# Patient Record
Sex: Male | Born: 1960 | Race: White | Hispanic: No | Marital: Married | Smoking: Former smoker
Health system: Southern US, Community
[De-identification: ages and names within clinical notes are randomized; demographics above are authoritative.]

## PROBLEM LIST (undated history)

## (undated) DIAGNOSIS — G4700 Insomnia, unspecified: Secondary | ICD-10-CM

## (undated) DIAGNOSIS — Z87891 Personal history of nicotine dependence: Secondary | ICD-10-CM

## (undated) DIAGNOSIS — T7840XA Allergy, unspecified, initial encounter: Secondary | ICD-10-CM

## (undated) HISTORY — DX: Allergy, unspecified, initial encounter: T78.40XA

## (undated) HISTORY — PX: MELANOMA EXCISION: SHX5266

## (undated) HISTORY — DX: Insomnia, unspecified: G47.00

## (undated) HISTORY — DX: Personal history of nicotine dependence: Z87.891

---

## 2006-10-26 ENCOUNTER — Ambulatory Visit (HOSPITAL_COMMUNITY): Admission: RE | Admit: 2006-10-26 | Discharge: 2006-10-26 | Payer: Self-pay | Admitting: Family Medicine

## 2006-12-13 ENCOUNTER — Encounter: Admission: RE | Admit: 2006-12-13 | Discharge: 2006-12-13 | Payer: Self-pay | Admitting: Family Medicine

## 2013-05-03 ENCOUNTER — Other Ambulatory Visit: Payer: Self-pay | Admitting: Family Medicine

## 2013-05-03 ENCOUNTER — Ambulatory Visit
Admission: RE | Admit: 2013-05-03 | Discharge: 2013-05-03 | Disposition: A | Discharge status: Home / Self Care | Payer: 59 | Source: Ambulatory Visit | Attending: Family Medicine | Admitting: Family Medicine

## 2013-05-03 DIAGNOSIS — M5430 Sciatica, unspecified side: Secondary | ICD-10-CM

## 2016-03-03 DIAGNOSIS — Z85828 Personal history of other malignant neoplasm of skin: Secondary | ICD-10-CM | POA: Diagnosis not present

## 2016-03-03 DIAGNOSIS — D220 Melanocytic nevi of lip: Secondary | ICD-10-CM | POA: Diagnosis not present

## 2016-03-03 DIAGNOSIS — L738 Other specified follicular disorders: Secondary | ICD-10-CM | POA: Diagnosis not present

## 2016-11-09 DIAGNOSIS — E785 Hyperlipidemia, unspecified: Secondary | ICD-10-CM | POA: Diagnosis not present

## 2016-11-09 DIAGNOSIS — G4700 Insomnia, unspecified: Secondary | ICD-10-CM | POA: Diagnosis not present

## 2016-11-09 DIAGNOSIS — E781 Pure hyperglyceridemia: Secondary | ICD-10-CM | POA: Diagnosis not present

## 2016-11-09 DIAGNOSIS — K219 Gastro-esophageal reflux disease without esophagitis: Secondary | ICD-10-CM | POA: Diagnosis not present

## 2017-04-20 DIAGNOSIS — L300 Nummular dermatitis: Secondary | ICD-10-CM | POA: Diagnosis not present

## 2017-04-20 DIAGNOSIS — Z85828 Personal history of other malignant neoplasm of skin: Secondary | ICD-10-CM | POA: Diagnosis not present

## 2017-04-20 DIAGNOSIS — B078 Other viral warts: Secondary | ICD-10-CM | POA: Diagnosis not present

## 2017-04-20 DIAGNOSIS — D225 Melanocytic nevi of trunk: Secondary | ICD-10-CM | POA: Diagnosis not present

## 2017-05-12 DIAGNOSIS — Z0000 Encounter for general adult medical examination without abnormal findings: Secondary | ICD-10-CM | POA: Diagnosis not present

## 2017-05-12 DIAGNOSIS — E7800 Pure hypercholesterolemia, unspecified: Secondary | ICD-10-CM | POA: Diagnosis not present

## 2017-05-12 DIAGNOSIS — R739 Hyperglycemia, unspecified: Secondary | ICD-10-CM | POA: Diagnosis not present

## 2017-05-12 DIAGNOSIS — G4700 Insomnia, unspecified: Secondary | ICD-10-CM | POA: Diagnosis not present

## 2017-05-12 DIAGNOSIS — K219 Gastro-esophageal reflux disease without esophagitis: Secondary | ICD-10-CM | POA: Diagnosis not present

## 2018-04-01 DIAGNOSIS — M545 Low back pain: Secondary | ICD-10-CM | POA: Diagnosis not present

## 2018-04-13 ENCOUNTER — Other Ambulatory Visit: Payer: Self-pay | Admitting: Family Medicine

## 2018-04-13 DIAGNOSIS — M5431 Sciatica, right side: Secondary | ICD-10-CM

## 2018-04-28 DIAGNOSIS — K219 Gastro-esophageal reflux disease without esophagitis: Secondary | ICD-10-CM | POA: Diagnosis not present

## 2018-04-28 DIAGNOSIS — E782 Mixed hyperlipidemia: Secondary | ICD-10-CM | POA: Diagnosis not present

## 2018-04-28 DIAGNOSIS — G4700 Insomnia, unspecified: Secondary | ICD-10-CM | POA: Diagnosis not present

## 2018-04-28 DIAGNOSIS — R7303 Prediabetes: Secondary | ICD-10-CM | POA: Diagnosis not present

## 2019-01-09 ENCOUNTER — Other Ambulatory Visit: Payer: Self-pay

## 2019-01-09 DIAGNOSIS — Z20822 Contact with and (suspected) exposure to covid-19: Secondary | ICD-10-CM

## 2019-01-10 LAB — NOVEL CORONAVIRUS, NAA: SARS-CoV-2, NAA: NOT DETECTED

## 2021-03-11 ENCOUNTER — Other Ambulatory Visit: Payer: Self-pay | Admitting: Family Medicine

## 2021-03-11 ENCOUNTER — Ambulatory Visit
Admission: RE | Admit: 2021-03-11 | Discharge: 2021-03-11 | Disposition: A | Discharge status: Home / Self Care | Payer: 59 | Source: Ambulatory Visit | Attending: Family Medicine | Admitting: Family Medicine

## 2021-03-11 DIAGNOSIS — R059 Cough, unspecified: Secondary | ICD-10-CM

## 2021-03-27 ENCOUNTER — Institutional Professional Consult (permissible substitution): Payer: 59 | Admitting: Pulmonary Disease

## 2021-03-31 ENCOUNTER — Other Ambulatory Visit: Payer: Self-pay

## 2021-03-31 ENCOUNTER — Ambulatory Visit: Payer: 59 | Admitting: Pulmonary Disease

## 2021-03-31 ENCOUNTER — Encounter: Payer: Self-pay | Admitting: Pulmonary Disease

## 2021-03-31 VITALS — BP 130/76 | HR 75 | Temp 98.6°F | Ht 69.0 in | Wt 210.6 lb

## 2021-03-31 DIAGNOSIS — Z720 Tobacco use: Secondary | ICD-10-CM

## 2021-03-31 DIAGNOSIS — R0609 Other forms of dyspnea: Secondary | ICD-10-CM | POA: Diagnosis not present

## 2021-03-31 DIAGNOSIS — R062 Wheezing: Secondary | ICD-10-CM | POA: Diagnosis not present

## 2021-03-31 NOTE — Patient Instructions (Signed)
Thank you for visiting Dr. Valeta Harms at [facility]. Today we recommend the following:  Orders Placed This Encounter  Procedures   Ambulatory Referral for Lung Cancer Scre   Pulmonary Function Test   Return in about 6 weeks (around 05/12/2021) for with APP or Dr. Valeta Harms.    Please do your part to reduce the spread of COVID-19.

## 2021-03-31 NOTE — Progress Notes (Signed)
Synopsis: Referred in February 2023 for COPD by Antony Contras, MD  Subjective:   PATIENT ID: Joseph Vance GENDER: male DOB: [birth year], MRN: [id number]  Chief Complaint  Patient presents with   Consult    Consult. Patient says he has been having trouble breathing.     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  He has smoked for 30+ years.  He quit back in December.  He had what sounds to be a mild COPD exacerbation.  He was treated with primary care started on albuterol and Trelegy sample.  He did well with this it broke his wheezing and chest tightness.  He now feels much better.  He is also starting to breathe better since he quit smoking.  He is able to complete all of his activities of daily living.  He is not limited with any significant exertional issues.   Past Medical History:  Diagnosis Date   Allergies    Former smoker    Insomnia      Family History  Problem Relation Age of Onset   Heart disease Father      Past Surgical History:  Procedure Laterality Date   MELANOMA EXCISION      Social History   Socioeconomic History   Marital status: Married    Spouse name: Not on file   Number of children: Not on file   Years of education: Not on file   Highest education level: Not on file  Occupational History   Not on file  Tobacco Use   Smoking status: Former    Types: Cigarettes   Smokeless tobacco: Never  Substance and Sexual Activity   Alcohol use: Never   Drug use: Never   Sexual activity: Never  Other Topics Concern   Not on file  Social History Narrative   Not on file   Social Determinants of Health   Financial Resource Strain: Not on file  Food Insecurity: Not on file  Transportation Needs: Not on file  Physical Activity: Not on file  Stress: Not on file  Social Connections: Not on file  Intimate Partner Violence: Not on file     Allergies  Allergen  Reactions   Codeine Rash   Sulfa Antibiotics Rash     Outpatient Medications Prior to Visit  Medication Sig Dispense Refill   albuterol (VENTOLIN HFA) 108 (90 Base) MCG/ACT inhaler      atorvastatin (LIPITOR) 20 MG tablet Take 1 tablet by mouth at bedtime.     Azelastine HCl 0.15 % SOLN SMARTSIG:2 Spray(s) Both Nares Twice Daily PRN     azithromycin (ZITHROMAX) 250 MG tablet Take 250 mg by mouth as directed.     cetirizine (ZYRTEC) 10 MG tablet Take 10 mg by mouth daily.     esomeprazole (NEXIUM) 40 MG capsule TAKE 1 CAPSULE BY MOUTH EVERY DAY IN THE MORNING     fluticasone (FLONASE) 50 MCG/ACT nasal spray instill 2 sprays into each nostril daily     fluticasone (FLOVENT HFA) 110 MCG/ACT inhaler TAKE 1 PUFF BY MOUTH TWICE A DAY     meloxicam (MOBIC) 15 MG tablet Take 1 tablet by mouth daily.     zolpidem (AMBIEN) 10 MG tablet 0.5 - 1 tablet     No facility-administered medications prior to visit.    Review of Systems  Constitutional:  Negative for chills, fever, malaise/fatigue and weight loss.  HENT:  Negative  for hearing loss, sore throat and tinnitus.   Eyes:  Negative for blurred vision and double vision.  Respiratory:  Positive for cough and wheezing. Negative for hemoptysis, sputum production, shortness of breath and stridor.   Cardiovascular:  Negative for chest pain, palpitations, orthopnea, leg swelling and PND.  Gastrointestinal:  Negative for abdominal pain, constipation, diarrhea, heartburn, nausea and vomiting.  Genitourinary:  Negative for dysuria, hematuria and urgency.  Musculoskeletal:  Negative for joint pain and myalgias.  Skin:  Negative for itching and rash.  Neurological:  Negative for dizziness, tingling, weakness and headaches.  Endo/Heme/Allergies:  Negative for environmental allergies. Does not bruise/bleed easily.  Psychiatric/Behavioral:  Negative for depression. The patient is not nervous/anxious and does not have insomnia.   All other systems reviewed  and are negative.   Objective:  Physical Exam Vitals reviewed.  Constitutional:      General: He is not in acute distress.    Appearance: He is well-developed. He is obese.  HENT:     Head: Normocephalic and atraumatic.  Eyes:     General: No scleral icterus.    Conjunctiva/sclera: Conjunctivae normal.     Pupils: Pupils are equal, round, and reactive to light.  Neck:     Vascular: No JVD.     Trachea: No tracheal deviation.  Cardiovascular:     Rate and Rhythm: Normal rate and regular rhythm.     Heart sounds: Normal heart sounds. No murmur heard. Pulmonary:     Effort: Pulmonary effort is normal. No tachypnea, accessory muscle usage or respiratory distress.     Breath sounds: No stridor. No wheezing, rhonchi or rales.  Abdominal:     General: There is no distension.     Palpations: Abdomen is soft.     Tenderness: There is no abdominal tenderness.  Musculoskeletal:        General: No tenderness.     Cervical back: Neck supple.  Lymphadenopathy:     Cervical: No cervical adenopathy.  Skin:    General: Skin is warm and dry.     Capillary Refill: Capillary refill takes less than 2 seconds.     Findings: No rash.  Neurological:     Mental Status: He is alert and oriented to person, place, and time.  Psychiatric:        Behavior: Behavior normal.     Vitals:   03/31/21 1534  BP: 130/76  Pulse: 75  Temp: 98.6 F (37 C)  TempSrc: Oral  SpO2: 98%  Weight: 210 lb 9.6 oz (95.5 kg)  Height: 5\' 9"  (1.753 m)   98% on RA BMI Readings from Last 3 Encounters:  03/31/21 31.10 kg/m   Wt Readings from Last 3 Encounters:  03/31/21 210 lb 9.6 oz (95.5 kg)     CBC No results found for: WBC, RBC, HGB, HCT, PLT, MCV, MCH, MCHC, RDW, LYMPHSABS, MONOABS, EOSABS, BASOSABS    Chest Imaging:   Pulmonary Functions Testing Results: No flowsheet data found.  FeNO:   Pathology:   Echocardiogram:   Heart Catheterization:     Assessment & Plan:     ICD-10-CM   1.  Tobacco use  Z72.0 Ambulatory Referral for Lung Cancer Scre    Pulmonary Function Test    2. DOE (dyspnea on exertion)  R06.09     3. Wheezing  R06.2       Discussion:  This is a 61 year old gentleman sounds like he had a recent COPD exacerbation, responded to Trelegy treatment with as needed  albuterol.  He is also quit smoking since this past December and quit any alcohol use even though he occasionally used alcohol.  From a respiratory standpoint he is improved.  Plan: Would recommend him to be enrolled in lung cancer screening program as he has smoked for several years quit just this past December. As for COPD he has not had any pulmonary function tests. Not interested in starting inhaler for daily regimen as he absolutely needed it. We will start with getting full PFTs and I explained that if he does have mild disease he may benefit from a inhaler to help prevent exacerbation.  He is okay with this.  Follow-up with [place] after his pulmonary function tests are complete to determine next best steps   Current Outpatient Medications:    albuterol (VENTOLIN HFA) 108 (90 Base) MCG/ACT inhaler, , Disp: , Rfl:    atorvastatin (LIPITOR) 20 MG tablet, Take 1 tablet by mouth at bedtime., Disp: , Rfl:    Azelastine HCl 0.15 % SOLN, SMARTSIG:2 Spray(s) Both Nares Twice Daily PRN, Disp: , Rfl:    azithromycin (ZITHROMAX) 250 MG tablet, Take 250 mg by mouth as directed., Disp: , Rfl:    cetirizine (ZYRTEC) 10 MG tablet, Take 10 mg by mouth daily., Disp: , Rfl:    esomeprazole (NEXIUM) 40 MG capsule, TAKE 1 CAPSULE BY MOUTH EVERY DAY IN THE MORNING, Disp: , Rfl:    fluticasone (FLONASE) 50 MCG/ACT nasal spray, instill 2 sprays into each nostril daily, Disp: , Rfl:    fluticasone (FLOVENT HFA) 110 MCG/ACT inhaler, TAKE 1 PUFF BY MOUTH TWICE A DAY, Disp: , Rfl:    meloxicam (MOBIC) 15 MG tablet, Take 1 tablet by mouth daily., Disp: , Rfl:    zolpidem (AMBIEN) 10 MG tablet, 0.5 - 1 tablet, Disp: , Rfl:     Garner Nash, DO [facility] 03/31/2021 3:48 PM

## 2021-05-01 ENCOUNTER — Other Ambulatory Visit: Payer: Self-pay

## 2021-05-01 ENCOUNTER — Ambulatory Visit: Payer: 59 | Admitting: Pulmonary Disease

## 2021-05-01 ENCOUNTER — Ambulatory Visit (INDEPENDENT_AMBULATORY_CARE_PROVIDER_SITE_OTHER): Payer: 59 | Admitting: Pulmonary Disease

## 2021-05-01 ENCOUNTER — Encounter: Payer: Self-pay | Admitting: Pulmonary Disease

## 2021-05-01 VITALS — BP 118/72 | HR 78 | Temp 97.8°F | Ht 69.0 in | Wt 209.0 lb

## 2021-05-01 DIAGNOSIS — R062 Wheezing: Secondary | ICD-10-CM

## 2021-05-01 DIAGNOSIS — Z720 Tobacco use: Secondary | ICD-10-CM | POA: Diagnosis not present

## 2021-05-01 LAB — PULMONARY FUNCTION TEST
DL/VA % pred: 129 %
DL/VA: 5.47 ml/min/mmHg/L
DLCO cor % pred: 109 %
DLCO cor: 29.04 ml/min/mmHg
DLCO unc % pred: 109 %
DLCO unc: 29.04 ml/min/mmHg
FEF 25-75 Post: 4.59 L/sec
FEF 25-75 Pre: 4.25 L/sec
FEF2575-%Change-Post: 8 %
FEF2575-%Pred-Post: 164 %
FEF2575-%Pred-Pre: 152 %
FEV1-%Change-Post: 4 %
FEV1-%Pred-Post: 102 %
FEV1-%Pred-Pre: 98 %
FEV1-Post: 3.51 L
FEV1-Pre: 3.37 L
FEV1FVC-%Change-Post: 1 %
FEV1FVC-%Pred-Pre: 112 %
FEV6-%Change-Post: 1 %
FEV6-%Pred-Post: 93 %
FEV6-%Pred-Pre: 91 %
FEV6-Post: 4.02 L
FEV6-Pre: 3.96 L
FEV6FVC-%Pred-Post: 105 %
FEV6FVC-%Pred-Pre: 105 %
FVC-%Change-Post: 2 %
FVC-%Pred-Post: 89 %
FVC-%Pred-Pre: 87 %
FVC-Post: 4.05 L
FVC-Pre: 3.96 L
Post FEV1/FVC ratio: 87 %
Post FEV6/FVC ratio: 100 %
Pre FEV1/FVC ratio: 85 %
Pre FEV6/FVC Ratio: 100 %
RV % pred: 83 %
RV: 1.86 L
TLC % pred: 87 %
TLC: 5.98 L

## 2021-05-01 NOTE — Patient Instructions (Signed)
Thank you for visiting Dr. Valeta Harms at [facility]. ?Today we recommend the following: ? ?Orders Placed This Encounter  ?Procedures  ? Ambulatory Referral for Lung Cancer Scre  ? ?Return if symptoms worsen or fail to improve. ? ? ? ?Please do your part to reduce the spread of COVID-19.  ? ?

## 2021-05-01 NOTE — Progress Notes (Signed)
Full PFT performed today. °

## 2021-05-01 NOTE — Progress Notes (Signed)
? ?Synopsis: Referred in February 2023 for COPD by Antony Contras, MD ? ?Subjective:  ? ?PATIENT ID: Joseph Vance GENDER: male DOB: Mar 14, 1960, MRN: [id number] ? ?Chief Complaint  ?Patient presents with  ? Follow-up  ?  Cough   ? ? 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  He quit back in December.  He had what sounds to be a mild COPD exacerbation.  He was treated with primary care started on albuterol and Trelegy sample.  He did well with this it broke his wheezing and chest tightness.  He now feels much better.  He is also starting to breathe better since he quit smoking.  He is able to complete all of his activities of daily living.  He is not limited with any significant exertional issues. ? ?OV 05/01/2021: when he took trelegy last his coughing and wheezing has stopped. He had pfts today that are normal. PFTs are normal and he has no evidence of COPD.  He thankfully has quit smoking.  He is proud of this.  He also has quit alcohol use.  After he quit smoking his episodes of wheezing and coughing has also improved. ? ? ?Past Medical History:  ?Diagnosis Date  ? Allergies   ? Former smoker   ? Insomnia   ?  ? ?Family History  ?Problem Relation Age of Onset  ? Heart disease Father   ?  ? ?Past Surgical History:  ?Procedure Laterality Date  ? MELANOMA EXCISION    ? ? ?Social History  ? ?Socioeconomic History  ? Marital status: Married  ?  Spouse name: Not on file  ? Number of children: Not on file  ? Years of education: Not on file  ? Highest education level: Not on file  ?Occupational History  ? Not on file  ?Tobacco Use  ? Smoking status: Former  ?  Types: Cigarettes  ? Smokeless tobacco: Never  ? Tobacco comments:  ?  Quit smoking before Christmas 2022  ?Substance and Sexual Activity  ? Alcohol use: Never  ? Drug use: Never  ? Sexual activity: Never  ?Other Topics Concern  ? Not on file  ?Social  History Narrative  ? Not on file  ? ?Social Determinants of Health  ? ?Financial Resource Strain: Not on file  ?Food Insecurity: Not on file  ?Transportation Needs: Not on file  ?Physical Activity: Not on file  ?Stress: Not on file  ?Social Connections: Not on file  ?Intimate Partner Violence: Not on file  ?  ? ?Allergies  ?Allergen Reactions  ? Codeine Rash  ? Sulfa Antibiotics Rash  ?  ? ?Outpatient Medications Prior to Visit  ?Medication Sig Dispense Refill  ? albuterol (VENTOLIN HFA) 108 (90 Base) MCG/ACT inhaler     ? atorvastatin (LIPITOR) 20 MG tablet Take 1 tablet by mouth at bedtime.    ? Azelastine HCl 0.15 % SOLN SMARTSIG:2 Spray(s) Both Nares Twice Daily PRN    ? azithromycin (ZITHROMAX) 250 MG tablet Take 250 mg by mouth as directed.    ? cetirizine (ZYRTEC) 10 MG tablet Take 10 mg by mouth daily.    ? esomeprazole (NEXIUM) 40 MG capsule TAKE 1 CAPSULE BY MOUTH EVERY DAY IN THE MORNING    ? fluticasone (FLONASE) 50 MCG/ACT nasal spray instill 2 sprays into each nostril daily    ? fluticasone (FLOVENT HFA) 110 MCG/ACT  inhaler TAKE 1 PUFF BY MOUTH TWICE A DAY    ? meloxicam (MOBIC) 15 MG tablet Take 1 tablet by mouth daily.    ? zolpidem (AMBIEN) 10 MG tablet 0.5 - 1 tablet    ? ?No facility-administered medications prior to visit.  ? ? ?Review of Systems  ?Constitutional:  Negative for chills, fever, malaise/fatigue and weight loss.  ?HENT:  Negative for hearing loss, sore throat and tinnitus.   ?Eyes:  Negative for blurred vision and double vision.  ?Respiratory:  Negative for cough, hemoptysis, sputum production, shortness of breath, wheezing and stridor.   ?Cardiovascular:  Negative for chest pain, palpitations, orthopnea, leg swelling and PND.  ?Gastrointestinal:  Negative for abdominal pain, constipation, diarrhea, heartburn, nausea and vomiting.  ?Genitourinary:  Negative for dysuria, hematuria and urgency.  ?Musculoskeletal:  Negative for joint pain and myalgias.  ?Skin:  Negative for itching and  rash.  ?Neurological:  Negative for dizziness, tingling, weakness and headaches.  ?Endo/Heme/Allergies:  Negative for environmental allergies. Does not bruise/bleed easily.  ?Psychiatric/Behavioral:  Negative for depression. The patient is not nervous/anxious and does not have insomnia.   ?All other systems reviewed and are negative. ? ? ?Objective:  ?Physical Exam ?Vitals reviewed.  ?Constitutional:   ?   General: He is not in acute distress. ?   Appearance: He is well-developed. He is obese.  ?HENT:  ?   Head: Normocephalic and atraumatic.  ?Eyes:  ?   General: No scleral icterus. ?   Conjunctiva/sclera: Conjunctivae normal.  ?   Pupils: Pupils are equal, round, and reactive to light.  ?Neck:  ?   Vascular: No JVD.  ?   Trachea: No tracheal deviation.  ?Cardiovascular:  ?   Rate and Rhythm: Normal rate and regular rhythm.  ?   Heart sounds: Normal heart sounds. No murmur heard. ?Pulmonary:  ?   Effort: Pulmonary effort is normal. No tachypnea, accessory muscle usage or respiratory distress.  ?   Breath sounds: No stridor. No wheezing, rhonchi or rales.  ?Abdominal:  ?   General: There is no distension.  ?   Palpations: Abdomen is soft.  ?   Tenderness: There is no abdominal tenderness.  ?Musculoskeletal:     ?   General: No tenderness.  ?   Cervical back: Neck supple.  ?Lymphadenopathy:  ?   Cervical: No cervical adenopathy.  ?Skin: ?   General: Skin is warm and dry.  ?   Capillary Refill: Capillary refill takes less than 2 seconds.  ?   Findings: No rash.  ?Neurological:  ?   Mental Status: He is alert and oriented to person, place, and time.  ?Psychiatric:     ?   Behavior: Behavior normal.  ? ? ? ?Vitals:  ? 05/01/21 1623  ?BP: 118/72  ?Pulse: 78  ?Temp: 97.8 ?F (36.6 ?C)  ?TempSrc: Oral  ?SpO2: 98%  ?Weight: 209 lb (94.8 kg)  ?Height: '5\' 9"'$  (1.753 m)  ? ?98% on RA ?BMI Readings from Last 3 Encounters:  ?05/01/21 30.86 kg/m?  ?03/31/21 31.10 kg/m?  ? ?Wt Readings from Last 3 Encounters:  ?05/01/21 209 lb (94.8  kg)  ?03/31/21 210 lb 9.6 oz (95.5 kg)  ? ? ? ?CBC ?No results found for: WBC, RBC, HGB, HCT, PLT, MCV, MCH, MCHC, RDW, LYMPHSABS, MONOABS, EOSABS, BASOSABS ? ? ? ?Chest Imaging: ? ? ?Pulmonary Functions Testing Results: ?PFT Results Latest Ref Rng & Units 05/01/2021  ?FVC-Pre L 3.96  ?FVC-Predicted Pre % 87  ?FVC-Post L  4.05  ?FVC-Predicted Post % 89  ?Pre FEV1/FVC % % 85  ?Post FEV1/FCV % % 87  ?FEV1-Pre L 3.37  ?FEV1-Predicted Pre % 98  ?FEV1-Post L 3.51  ?DLCO uncorrected ml/min/mmHg 29.04  ?DLCO UNC% % 109  ?DLCO corrected ml/min/mmHg 29.04  ?DLCO COR %Predicted % 109  ?DLVA Predicted % 129  ?TLC L 5.98  ?TLC % Predicted % 87  ?RV % Predicted % 83  ? ? ?FeNO:  ? ?Pathology:  ? ?Echocardiogram:  ? ?Heart Catheterization:  ?   ?Assessment & Plan:  ? ?  ICD-10-CM   ?1. Tobacco use  Z72.0 Ambulatory Referral for Lung Cancer Scre  ?  ?2. Wheezing  R06.2   ?  ? ? ?Discussion: ? ?This is a 61 year old gentleman which sound like he had a upper respiratory tract infection/COPD exacerbation that improved after steroids.  He also started Trelegy for short period he stopped using it.  He had PFTs completed today's office visit which revealed no evidence of obstruction. ? ?Plan: ?Enrollment in lung cancer screening program ?No need for inhalers at this time. ?He is can let us know if his symptoms return or change. ?He may benefit from having albuterol as needed. ?Return to clinic to see me as needed. ?Follow annually in LDCT screening program. ? ? ?Current Outpatient Medications:  ?  albuterol (VENTOLIN HFA) 108 (90 Base) MCG/ACT inhaler, , Disp: , Rfl:  ?  atorvastatin (LIPITOR) 20 MG tablet, Take 1 tablet by mouth at bedtime., Disp: , Rfl:  ?  Azelastine HCl 0.15 % SOLN, SMARTSIG:2 Spray(s) Both Nares Twice Daily PRN, Disp: , Rfl:  ?  azithromycin (ZITHROMAX) 250 MG tablet, Take 250 mg by mouth as directed., Disp: , Rfl:  ?  cetirizine (ZYRTEC) 10 MG tablet, Take 10 mg by mouth daily., Disp: , Rfl:  ?  esomeprazole  (NEXIUM) 40 MG capsule, TAKE 1 CAPSULE BY MOUTH EVERY DAY IN THE MORNING, Disp: , Rfl:  ?  fluticasone (FLONASE) 50 MCG/ACT nasal spray, instill 2 sprays into each nostril daily, Disp: , Rfl:  ?  fluticason

## 2021-05-01 NOTE — Patient Instructions (Signed)
Full PFT performed today. °

## 2021-05-21 ENCOUNTER — Other Ambulatory Visit: Payer: Self-pay | Admitting: *Deleted

## 2021-05-21 DIAGNOSIS — Z122 Encounter for screening for malignant neoplasm of respiratory organs: Secondary | ICD-10-CM

## 2021-05-21 DIAGNOSIS — Z87891 Personal history of nicotine dependence: Secondary | ICD-10-CM

## 2021-06-04 ENCOUNTER — Encounter: Payer: Self-pay | Admitting: Acute Care

## 2021-06-04 ENCOUNTER — Ambulatory Visit (INDEPENDENT_AMBULATORY_CARE_PROVIDER_SITE_OTHER): Payer: 59 | Admitting: Acute Care

## 2021-06-04 DIAGNOSIS — Z87891 Personal history of nicotine dependence: Secondary | ICD-10-CM

## 2021-06-04 NOTE — Progress Notes (Signed)
Virtual Visit via Telephone Note ? ?I connected with Olive L Alcaide on 06/04/21 at 12:00 PM EDT by telephone and verified that I am speaking with the correct person using two identifiers. ? ?Location: ?Patient:  At his office ?Provider:  [facility], [place], [place], [address] ? ?  ?I discussed the limitations, risks, security and privacy concerns of performing an evaluation and management service by telephone and the availability of in person appointments. I also discussed with the patient that there may be a patient responsible charge related to this service. The patient expressed understanding and agreed to proceed. ? ? ? ?Shared Decision Making Visit Lung Cancer Screening Program ?[phone]) ? ? ?Eligibility: ?Age 61 y.o. ?Pack Years Smoking History Calculation 35 pack year smoking history ?(# packs/per year x # years smoked) ?Recent History of coughing up blood  no ?Unexplained weight loss? no ?( >Than 15 pounds within the last 6 months ) ?Prior History Lung / other cancer no ?(Diagnosis within the last 5 years already requiring surveillance chest CT Scans). ?Smoking Status Former ?Former Smokers: Years since quit:  < 1 year ? Quit Date:  12/2020 ? ?Visit Components: ?Discussion included one or more decision making aids. yes ?Discussion included risk/benefits of screening. yes ?Discussion included potential follow up diagnostic testing for abnormal scans. yes ?Discussion included meaning and risk of over diagnosis. yes ?Discussion included meaning and risk of False Positives. yes ?Discussion included meaning of total radiation exposure. yes ? ?Counseling Included: ?Importance of adherence to annual lung cancer LDCT screening. yes ?Impact of comorbidities on ability to participate in the program. yes ?Ability and willingness to under diagnostic treatment. yes ? ?Smoking Cessation Counseling: ?Current Smokers:  ?Discussed importance of smoking cessation. yes ?Information about tobacco cessation classes and  interventions provided to patient. yes ?Patient provided with "ticket" for LDCT Scan. yes ?Symptomatic Patient. no ? Counseling NA ?Diagnosis Code: Tobacco Use Z72.0 ?Asymptomatic Patient yes ? Counseling (Intermediate counseling: > three minutes counseling) [id number] ?Former Smokers:  ?Discussed the importance of maintaining cigarette abstinence. yes ?Diagnosis Code: Personal History of Nicotine Dependence. J50.093 ?Information about tobacco cessation classes and interventions provided to patient. Yes ?Patient provided with "ticket" for LDCT Scan. yes ?Written Order for Lung Cancer Screening with LDCT placed in Epic. Yes ?(CT Chest Lung Cancer Screening Low Dose W/O CM) [id number] ?Z12.2-Screening of respiratory organs ?Z87.891-Personal history of nicotine dependence ? ?I spent 25 minutes of face to face time/virtual visit time  with Mr. Carrizales discussing the risks and benefits of lung cancer screening. We took the time to pause the power point at intervals to allow for questions to be asked and answered to ensure understanding. We discussed that he had taken the single most powerful action possible to decrease his risk of developing lung cancer when he quit smoking. I counseled him to remain smoke free, and to contact me if he ever had the desire to smoke again so that I can provide resources and tools to help support the effort to remain smoke free. We discussed the time and location of the scan, and that either  Doroteo Glassman RN, Joella Prince, RN or I  or I will call / send a letter with the results within  24-72 hours of receiving them. He has the office contact information in the event he needs to speak with me,  he verbalized understanding of all of the above and had no further questions upon leaving the office.  ? ? ? ?I explained to the patient  that there has been a high incidence of coronary artery disease noted on these exams. I explained that this is a non-gated exam therefore degree or severity cannot be  determined. This patient is on statin therapy. I have asked the patient to follow-up with their PCP regarding any incidental finding of coronary artery disease and management with diet or medication as they feel is clinically indicated. The patient verbalized understanding of the above and had no further questions. ?  ? ? ?Magdalen Spatz, NP ?06/04/2021 ? ? ? ? ? ? ?

## 2021-06-04 NOTE — Patient Instructions (Signed)
Thank you for participating in the [facility]. It was our pleasure to meet you today. We will call you with the results of your scan within the next few days. Your scan will be assigned a Lung RADS category score by the physicians reading the scans.  This Lung RADS score determines follow up scanning.  See below for description of categories, and follow up screening recommendations. We will be in touch to schedule your follow up screening annually or based on recommendations of our providers. We will fax a copy of your scan results to your Primary Care Physician, or the physician who referred you to the program, to ensure they have the results. Please call the office if you have any questions or concerns regarding your scanning experience or results.  Our office number is [phone]. Please speak with Denise Phelps, RN. , or  Denise Buckner RN, They are  our Lung Cancer Screening RN.'s If They are unavailable when you call, Please leave a message on the voice mail. We will return your call at our earliest convenience.This voice mail is monitored several times a day.  Remember, if your scan is normal, we will scan you annually as long as you continue to meet the criteria for the program. (Age 55-77, Current smoker or smoker who has quit within the last 15 years). If you are a smoker, remember, quitting is the single most powerful action that you can take to decrease your risk of lung cancer and other pulmonary, breathing related problems. We know quitting is hard, and we are here to help.  Please let us know if there is anything we can do to help you meet your goal of quitting. If you are a former smoker, congratulations. We are proud of you! Remain smoke free! Remember you can refer friends or family members through the number above.  We will screen them to make sure they meet criteria for the program. Thank you for helping us take better care of you by  participating in Lung Screening.  You can receive free nicotine replacement therapy ( patches, gum or mints) by calling 1-800-QUIT NOW. Please call so we can get you on the path to becoming  a non-smoker. I know it is hard, but you can do this!  Lung RADS Categories:  Lung RADS 1: no nodules or definitely non-concerning nodules.  Recommendation is for a repeat annual scan in 12 months.  Lung RADS 2:  nodules that are non-concerning in appearance and behavior with a very low likelihood of becoming an active cancer. Recommendation is for a repeat annual scan in 12 months.  Lung RADS 3: nodules that are probably non-concerning , includes nodules with a low likelihood of becoming an active cancer.  Recommendation is for a 6-month repeat screening scan. Often noted after an upper respiratory illness. We will be in touch to make sure you have no questions, and to schedule your 6-month scan.  Lung RADS 4 A: nodules with concerning findings, recommendation is most often for a follow up scan in 3 months or additional testing based on our provider's assessment of the scan. We will be in touch to make sure you have no questions and to schedule the recommended 3 month follow up scan.  Lung RADS 4 B:  indicates findings that are concerning. We will be in touch with you to schedule additional diagnostic testing based on our provider's  assessment of the scan.  Other options for assistance in smoking cessation (   As covered by your insurance benefits)  Hypnosis for smoking cessation  [employer] [phone]  Acupuncture for smoking cessation  [facility] [phone]   

## 2021-06-05 ENCOUNTER — Ambulatory Visit (INDEPENDENT_AMBULATORY_CARE_PROVIDER_SITE_OTHER)
Admission: RE | Admit: 2021-06-05 | Discharge: 2021-06-05 | Disposition: A | Discharge status: Home / Self Care | Payer: 59 | Source: Ambulatory Visit | Attending: Acute Care | Admitting: Acute Care

## 2021-06-05 DIAGNOSIS — Z87891 Personal history of nicotine dependence: Secondary | ICD-10-CM

## 2021-06-05 DIAGNOSIS — Z122 Encounter for screening for malignant neoplasm of respiratory organs: Secondary | ICD-10-CM

## 2021-06-09 ENCOUNTER — Other Ambulatory Visit: Payer: Self-pay

## 2021-06-09 DIAGNOSIS — Z87891 Personal history of nicotine dependence: Secondary | ICD-10-CM

## 2021-06-09 DIAGNOSIS — Z122 Encounter for screening for malignant neoplasm of respiratory organs: Secondary | ICD-10-CM

## 2021-12-18 ENCOUNTER — Ambulatory Visit: Payer: 59 | Admitting: Cardiology

## 2021-12-22 ENCOUNTER — Ambulatory Visit: Payer: 59 | Attending: Cardiology | Admitting: Internal Medicine

## 2021-12-22 ENCOUNTER — Encounter: Payer: Self-pay | Admitting: Internal Medicine

## 2021-12-22 VITALS — BP 136/78 | HR 89 | Ht 68.0 in | Wt 222.0 lb

## 2021-12-22 DIAGNOSIS — R072 Precordial pain: Secondary | ICD-10-CM

## 2021-12-22 DIAGNOSIS — R079 Chest pain, unspecified: Secondary | ICD-10-CM

## 2021-12-22 MED ORDER — ASPIRIN 81 MG PO TBEC: 81.0000 mg | DELAYED_RELEASE_TABLET | Freq: Every day | ORAL | 3 refills | Status: AC

## 2021-12-22 MED ORDER — METOPROLOL TARTRATE 50 MG PO TABS: 50.0000 mg | ORAL_TABLET | Freq: Once | ORAL | 0 refills | Status: DC

## 2021-12-22 NOTE — Patient Instructions (Signed)
Medication Instructions:  START: ASPIRIN '81mg'$  ONCE DAILY  **PLEASE TAKE METOPROLOL TARTRATE '50mg'$  TWO HOURS PRIOR TO CCTA SCAN**  *If you need a refill on your cardiac medications before your next appointment, please call your pharmacy*  Lab Work: BMET BLOOD WORK TODAY  If you have labs (blood work) drawn today and your tests are completely normal, you will receive your results only by: MyChart Message (if you have MyChart) OR A paper copy in the mail If you have any lab test that is abnormal or we need to change your treatment, we will call you to review the results.  Testing/Procedures: Your physician has requested that you have an echocardiogram. Echocardiography is a painless test that uses sound waves to create images of your heart. It provides your doctor with information about the size and shape of your heart and how well your heart's chambers and valves are working. You may receive an ultrasound enhancing agent through an IV if needed to better visualize your heart during the echo.This procedure takes approximately one hour. There are no restrictions for this procedure. This will take place at the [address].   Your physician has requested that you have cardiac CT. Cardiac computed tomography (CT) is a painless test that uses an x-ray machine to take clear, detailed pictures of your heart. For further information please visit [email or web address]. Please follow instruction sheet as given.  Follow-Up: At [facility], you and your health needs are our priority.  As part of our continuing mission to provide you with exceptional heart care, we have created designated Provider Care Teams.  These Care Teams include your primary Cardiologist (physician) and Advanced Practice Providers (APPs -  Physician Assistants and Nurse Practitioners) who all work together to provide you with the care you need, when you need it.  Your next appointment:   3 month(s)  The format  for your next appointment:   In Person  Provider:   Janina Mayo, MD     Other Instructions   Your cardiac CT will be scheduled at one of the below locations:   [facility] [address] [place], [place] [address] [phone]  If scheduled at [facility], please arrive at the [facility] and Children's Entrance (Entrance C2) of [facility] 30 minutes prior to test start time. You can use the FREE valet parking offered at entrance C (encouraged to control the heart rate for the test)  Proceed to the [facility] Radiology Department (first floor) to check-in and test prep.  All radiology patients and guests should use entrance C2 at [facility], accessed from [employer], even though the hospital's physical address listed is [address]     Please follow these instructions carefully (unless otherwise directed):  Hold all erectile dysfunction medications at least 3 days (72 hrs) prior to test. (Ie viagra, cialis, sildenafil, tadalafil, etc) We will administer nitroglycerin during this exam.   On the Night Before the Test: Be sure to Drink plenty of water. Do not consume any caffeinated/decaffeinated beverages or chocolate 12 hours prior to your test. Do not take any antihistamines 12 hours prior to your test.  On the Day of the Test: Drink plenty of water until 1 hour prior to the test. Do not eat any food 1 hour prior to test. You may take your regular medications prior to the test.  Take metoprolol (Lopressor) two hours prior to test. HOLD Furosemide/Hydrochlorothiazide morning  of the test  After the Test: Drink plenty of water. After receiving IV contrast, you may experience a mild flushed feeling. This is normal. On occasion, you may experience a mild rash up to 24 hours after the test. This is not dangerous. If this occurs, you can take Benadryl 25 mg and increase your fluid intake. If you experience trouble  breathing, this can be serious. If it is severe call 911 IMMEDIATELY. If it is mild, please call our office. If you take any of these medications: Glipizide/Metformin, Avandament, Glucavance, please do not take 48 hours after completing test unless otherwise instructed.  We will call to schedule your test 2-4 weeks out understanding that some insurance companies will need an authorization prior to the service being performed.   For non-scheduling related questions, please contact the cardiac imaging nurse navigator should you have any questions/concerns: Marchia Bond, Cardiac Imaging Nurse Navigator Gordy Clement, Cardiac Imaging Nurse Navigator [facility] Direct Office Dial: [phone]   For scheduling needs, including cancellations and rescheduling, please call [place], [phone].

## 2021-12-22 NOTE — Progress Notes (Signed)
Cardiology Office Note:    Date:  12/22/2021   ID:  Joseph Vance, DOB September 10, 1960, MRN [id number]  PCP:  Antony Contras, MD   [place] Providers Cardiologist:  None     Referring MD: Antony Contras, MD   No chief complaint on file. Family hx  History of Present Illness:    Joseph Vance is a 61 y.o. male with a hx of HLD, former smoker, depression, GERD, referral from [facility] for family hx of cardiovascular dx. CT in April for lung ca screening showed RCA and LAD dx.   Notes some CP with getting up from a chair or turning a certain way. Hasn't noticed it with mowing in the yard. It's left sided. It's been happening for several weeks. He's on lipitor 20 mg daily.  No DOE.  No orthopnea/PND or LE edema  Family Hx: Father had MI at 34 years old.   Social Hx: smoked from the age of 65s and quit 2022.   Past Medical History:  Diagnosis Date   Allergies    Former smoker    Insomnia     Past Surgical History:  Procedure Laterality Date   MELANOMA EXCISION      Current Medications: No outpatient medications have been marked as taking for the 12/22/21 encounter (Appointment) with Janina Mayo, MD.     Allergies:   Codeine and Sulfa antibiotics   Social History   Socioeconomic History   Marital status: Married    Spouse name: Not on file   Number of children: Not on file   Years of education: Not on file   Highest education level: Not on file  Occupational History   Not on file  Tobacco Use   Smoking status: Former    Types: Cigarettes   Smokeless tobacco: Never   Tobacco comments:    Quit smoking before Christmas 2022  Substance and Sexual Activity   Alcohol use: Never   Drug use: Never   Sexual activity: Never  Other Topics Concern   Not on file  Social History Narrative   Not on file   Social Determinants of Health   Financial Resource Strain: Not on file  Food Insecurity: Not on file  Transportation Needs: Not on file  Physical  Activity: Not on file  Stress: Not on file  Social Connections: Not on file     Family History: The patient's family history includes Heart disease in his father.  ROS:   Please see the history of present illness.     All other systems reviewed and are negative.  EKGs/Labs/Other Studies Reviewed:    The following studies were reviewed today:   EKG:  EKG is  ordered today.  The ekg ordered today demonstrates   12/22/2021- NSR  Recent Labs: No results found for requested labs within last 365 days.   Recent Lipid Panel No results found for: "CHOL", "TRIG", "HDL", "CHOLHDL", "VLDL", "LDLCALC", "LDLDIRECT"   Risk Assessment/Calculations:         Physical Exam:    VS:  Vitals:   12/22/21 1356  BP: 136/78  Pulse: 89  SpO2: 94%     Wt Readings from Last 3 Encounters:  05/01/21 209 lb (94.8 kg)  03/31/21 210 lb 9.6 oz (95.5 kg)     GEN:  Well nourished, well developed in no acute distress HEENT: Normal NECK: No JVD; No carotid bruits LYMPHATICS: No lymphadenopathy CARDIAC: RRR, no murmurs, rubs, gallops RESPIRATORY:  Clear to auscultation  without rales, wheezing or rhonchi  VASC: 2+ radial pulses BL ABDOMEN: Soft, non-tender, non-distended MUSCULOSKELETAL:  No edema; No deformity  SKIN: Warm and dry NEUROLOGIC:  Alert and oriented x 3 PSYCHIATRIC:  Normal affect   ASSESSMENT:    CAD: seen on screening chest CT. Will start asa 81 mg daily.  LDL 65; at goal < 70 mg/dL 11/25/2021.  Continue lipitor 20 mg daily. With CP, will obtain CTA and if has a significant lesion(s) can get FFR.  PLAN:    In order of problems listed above:  Coronary CTA morph , metop tartrate 50 mg x1 TTE Follow up 3 months           Medication Adjustments/Labs and Tests Ordered: Current medicines are reviewed at length with the patient today.  Concerns regarding medicines are outlined above.  No orders of the defined types were placed in this encounter.  No orders of the  defined types were placed in this encounter.   There are no Patient Instructions on file for this visit.   Signed, Janina Mayo, MD  12/22/2021 8:20 AM    [place]

## 2021-12-23 LAB — BASIC METABOLIC PANEL: Calcium: 9.3 mg/dL (ref 8.6–10.2)

## 2021-12-24 LAB — BASIC METABOLIC PANEL
BUN/Creatinine Ratio: 15 (ref 10–24)
CO2: 21 mmol/L (ref 20–29)
Glucose: 138 mg/dL — ABNORMAL HIGH (ref 70–99)
Potassium: 4 mmol/L (ref 3.5–5.2)
Sodium: 142 mmol/L (ref 134–144)
eGFR: 98 mL/min/{1.73_m2} (ref >59)

## 2022-01-01 ENCOUNTER — Telehealth (HOSPITAL_COMMUNITY): Payer: Self-pay | Admitting: *Deleted

## 2022-01-01 NOTE — Telephone Encounter (Signed)
Attempted to call patient regarding upcoming cardiac CT appointment. °Left message on voicemail with name and callback number ° °Puanani Gene RN Navigator Cardiac Imaging °[facility] °[phone] Office °[phone] Cell ° °

## 2022-01-02 ENCOUNTER — Ambulatory Visit (HOSPITAL_COMMUNITY)
Admission: RE | Admit: 2022-01-02 | Discharge: 2022-01-02 | Disposition: A | Discharge status: Home / Self Care | Payer: 59 | Source: Ambulatory Visit | Attending: Internal Medicine | Admitting: Internal Medicine

## 2022-01-02 DIAGNOSIS — R072 Precordial pain: Secondary | ICD-10-CM

## 2022-01-02 MED ORDER — NITROGLYCERIN 0.4 MG SL SUBL
0.8000 mg | SUBLINGUAL_TABLET | Freq: Once | SUBLINGUAL | Status: AC
  Administered 2022-01-02: 0.8 mg via SUBLINGUAL

## 2022-01-02 MED ORDER — IOHEXOL 350 MG/ML SOLN
100.0000 mL | Freq: Once | INTRAVENOUS | Status: AC | PRN
  Administered 2022-01-02: 100 mL via INTRAVENOUS

## 2022-01-02 MED ORDER — NITROGLYCERIN 0.4 MG SL SUBL
SUBLINGUAL_TABLET | SUBLINGUAL | Status: AC
  Filled 2022-01-02: qty 2

## 2022-01-06 ENCOUNTER — Ambulatory Visit (HOSPITAL_COMMUNITY): Payer: 59

## 2022-01-14 ENCOUNTER — Ambulatory Visit (HOSPITAL_COMMUNITY): Payer: 59 | Attending: Cardiovascular Disease

## 2022-01-14 DIAGNOSIS — R072 Precordial pain: Secondary | ICD-10-CM | POA: Insufficient documentation

## 2022-01-14 LAB — ECHOCARDIOGRAM COMPLETE
Area-P 1/2: 3.5 cm2
S' Lateral: 3.5 cm

## 2022-03-23 ENCOUNTER — Ambulatory Visit: Payer: 59 | Attending: Internal Medicine | Admitting: Internal Medicine

## 2022-03-23 ENCOUNTER — Encounter: Payer: Self-pay | Admitting: Internal Medicine

## 2022-03-23 VITALS — BP 122/82 | HR 75 | Ht 67.0 in | Wt 230.2 lb

## 2022-03-23 DIAGNOSIS — R931 Abnormal findings on diagnostic imaging of heart and coronary circulation: Secondary | ICD-10-CM

## 2022-03-23 NOTE — Patient Instructions (Signed)
Medication Instructions:  No Changes In Medications at this time.  *If you need a refill on your cardiac medications before your next appointment, please call your pharmacy*  Lab Work: None Ordered At This Time.  If you have labs (blood work) drawn today and your tests are completely normal, you will receive your results only by: [place] (if you have MyChart) OR A paper copy in the mail If you have any lab test that is abnormal or we need to change your treatment, we will call you to review the results.  Testing/Procedures: None Ordered At This Time.   Follow-Up: At [facility], you and your health needs are our priority.  As part of our continuing mission to provide you with exceptional heart care, we have created designated Provider Care Teams.  These Care Teams include your primary Cardiologist (physician) and Advanced Practice Providers (APPs -  Physician Assistants and Nurse Practitioners) who all work together to provide you with the care you need, when you need it.  Your next appointment:   1 year(s)  Provider:   Janina Mayo, MD

## 2022-03-23 NOTE — Progress Notes (Signed)
Cardiology Office Note:    Date:  03/23/2022   ID:  Joseph Vance, DOB 06-27-1960, MRN [id number]  PCP:  Antony Contras, MD   [place] Providers Cardiologist:  Janina Mayo, MD     Referring MD: Antony Contras, MD   No chief complaint on file. Family hx  History of Present Illness:    Joseph Vance is a 62 y.o. male with a hx of HLD, former smoker, depression, GERD, referral from [facility] for family hx of cardiovascular dx. CT in April for lung ca screening showed RCA and LAD dx.   Notes some CP with getting up from a chair or turning a certain way. Hasn't noticed it with mowing in the yard. It's left sided. It's been happening for several weeks. He's on lipitor 20 mg daily.  No DOE.  No orthopnea/PND or LE edema  Family Hx: Father had MI at 51 years old.   Social Hx: smoked from the age of 8s and quit 2022.   Interim Hx 03/23/2022 He is doing ok. No issues.   Past Medical History:  Diagnosis Date   Allergies    Former smoker    Insomnia     Past Surgical History:  Procedure Laterality Date   MELANOMA EXCISION      Current Medications: No outpatient medications have been marked as taking for the 03/23/22 encounter (Appointment) with Janina Mayo, MD.     Allergies:   Codeine and Sulfa antibiotics   Social History   Socioeconomic History   Marital status: Married    Spouse name: Not on file   Number of children: Not on file   Years of education: Not on file   Highest education level: Not on file  Occupational History   Not on file  Tobacco Use   Smoking status: Former    Types: Cigarettes   Smokeless tobacco: Never   Tobacco comments:    Quit smoking before Christmas 2022  Substance and Sexual Activity   Alcohol use: Never   Drug use: Never   Sexual activity: Never  Other Topics Concern   Not on file  Social History Narrative   Not on file   Social Determinants of Health   Financial Resource Strain: Not on file  Food  Insecurity: Not on file  Transportation Needs: Not on file  Physical Activity: Not on file  Stress: Not on file  Social Connections: Not on file     Family History: The patient's family history includes Heart disease in his father.  ROS:   Please see the history of present illness.     All other systems reviewed and are negative.  EKGs/Labs/Other Studies Reviewed:    The following studies were reviewed today:   EKG:  EKG is  ordered today.  The ekg ordered today demonstrates   12/22/2021- NSR  Recent Labs: 12/22/2021: BUN 13; Creatinine, Ser 0.87; Potassium 4.0; Sodium 142   Recent Lipid Panel No results found for: "CHOL", "TRIG", "HDL", "CHOLHDL", "VLDL", "LDLCALC", "LDLDIRECT"   Risk Assessment/Calculations:         Physical Exam:    VS:  Vitals:   03/23/22 0809  BP: 122/82  Pulse: 75  SpO2: 92%     Wt Readings from Last 3 Encounters:  12/22/21 222 lb (100.7 kg)  05/01/21 209 lb (94.8 kg)  03/31/21 210 lb 9.6 oz (95.5 kg)     GEN:  Well nourished, well developed in no acute distress HEENT: Normal  NECK: No JVD; No carotid bruits LYMPHATICS: No lymphadenopathy CARDIAC: RRR, no murmurs, rubs, gallops RESPIRATORY:  Clear to auscultation without rales, wheezing or rhonchi  ABDOMEN: Soft, non-tender, non-distended MUSCULOSKELETAL:  No edema; No deformity  SKIN: Warm and dry NEUROLOGIC:  Alert and oriented x 3 PSYCHIATRIC:  Normal affect   ASSESSMENT:    CAD: seen on screening chest CT. Will start asa 81 mg daily.  LDL 65; at goal < 70 mg/dL 11/25/2021.  Continue lipitor 20 mg daily. With CP, obtained CTA, showed CAC score 354 which was 83rd percentile.  LDL 65 mg/dL. At goal. Echo was normal.  PLAN:    In order of problems listed above:  Follow up 12 months           Medication Adjustments/Labs and Tests Ordered: Current medicines are reviewed at length with the patient today.  Concerns regarding medicines are outlined above.  No orders of the  defined types were placed in this encounter.  No orders of the defined types were placed in this encounter.   There are no Patient Instructions on file for this visit.   Signed, Janina Mayo, MD  03/23/2022 8:08 AM    [place]

## 2022-06-08 ENCOUNTER — Ambulatory Visit (HOSPITAL_BASED_OUTPATIENT_CLINIC_OR_DEPARTMENT_OTHER)
Admission: RE | Admit: 2022-06-08 | Discharge: 2022-06-08 | Disposition: A | Discharge status: Home / Self Care | Payer: 59 | Source: Ambulatory Visit | Attending: Acute Care | Admitting: Acute Care

## 2022-06-08 DIAGNOSIS — I700 Atherosclerosis of aorta: Secondary | ICD-10-CM | POA: Diagnosis not present

## 2022-06-08 DIAGNOSIS — J439 Emphysema, unspecified: Secondary | ICD-10-CM | POA: Insufficient documentation

## 2022-06-08 DIAGNOSIS — I2510 Atherosclerotic heart disease of native coronary artery without angina pectoris: Secondary | ICD-10-CM | POA: Diagnosis not present

## 2022-06-08 DIAGNOSIS — Z122 Encounter for screening for malignant neoplasm of respiratory organs: Secondary | ICD-10-CM

## 2022-06-08 DIAGNOSIS — Z87891 Personal history of nicotine dependence: Secondary | ICD-10-CM | POA: Diagnosis not present

## 2022-06-11 ENCOUNTER — Other Ambulatory Visit: Payer: Self-pay | Admitting: Acute Care

## 2022-06-11 DIAGNOSIS — Z87891 Personal history of nicotine dependence: Secondary | ICD-10-CM

## 2022-06-11 DIAGNOSIS — Z122 Encounter for screening for malignant neoplasm of respiratory organs: Secondary | ICD-10-CM

## 2022-12-26 IMAGING — CT CT CHEST LUNG CANCER SCREENING LOW DOSE W/O CM
2 of 4 series · 15 of 36 positions shown, 18 images · non-contrast
Comparison: Chest radiograph 03/11/2021.

CLINICAL DATA: 35 pack-year smoking history/quit 2 years ago



[Series 3: lung thins 1.0 · axial · 0.80mm/px · z∈[-353,-51]mm · 12 of 334 slices shown, 15 images]
[im 16/334  mediastinal]
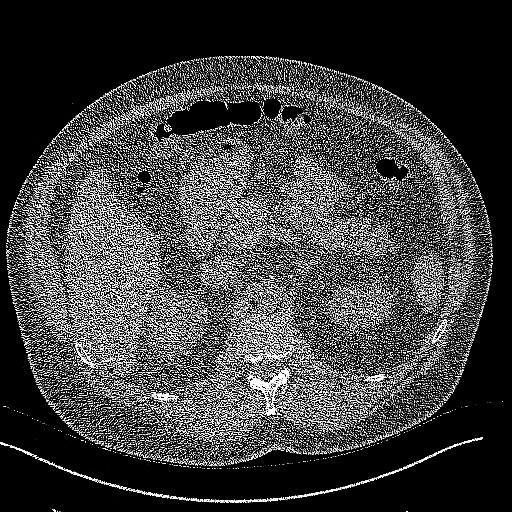
[im 16/334  lung]
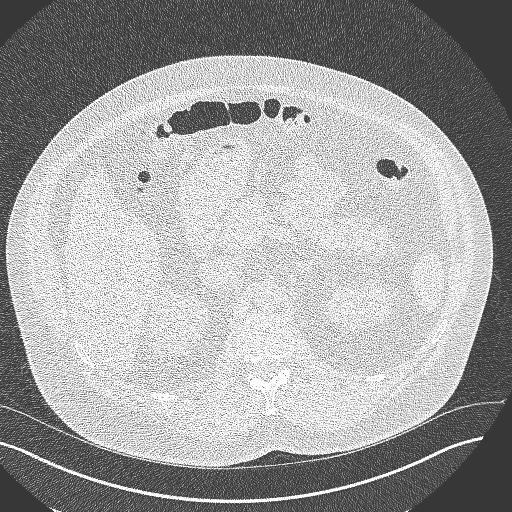
[im 46/334  lung]
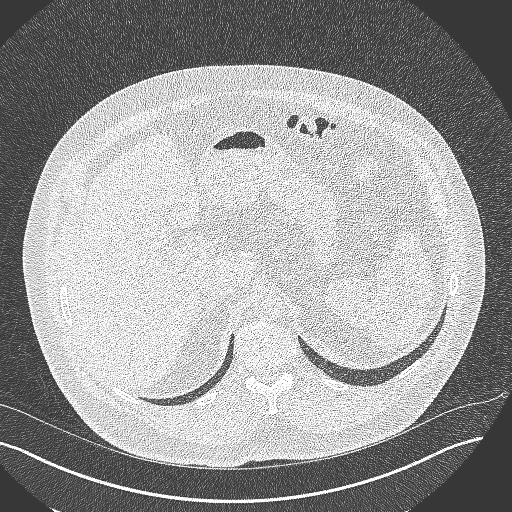
[im 76/334  lung]
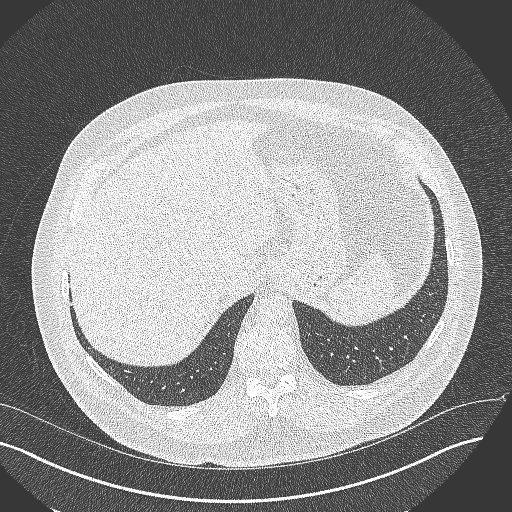
[im 106/334  lung]
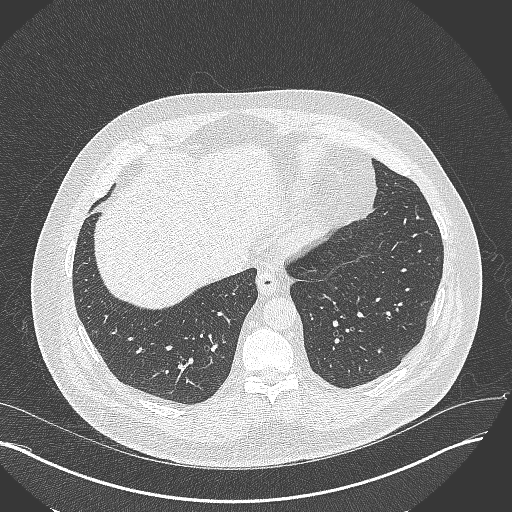
[im 122/334  mediastinal]
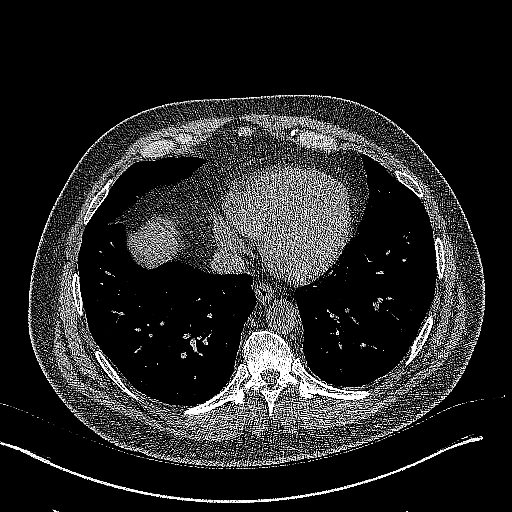
[im 122/334  lung]
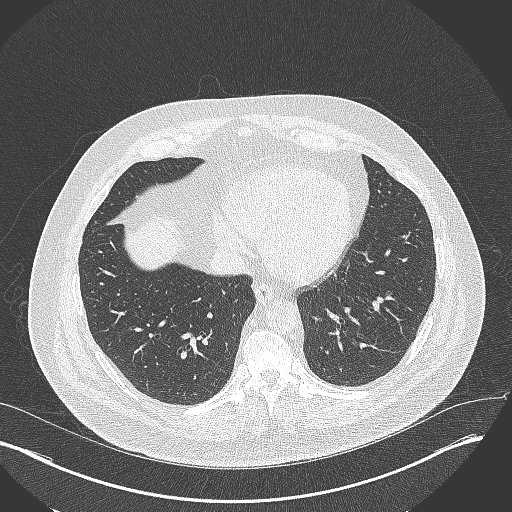
[im 152/334  lung]
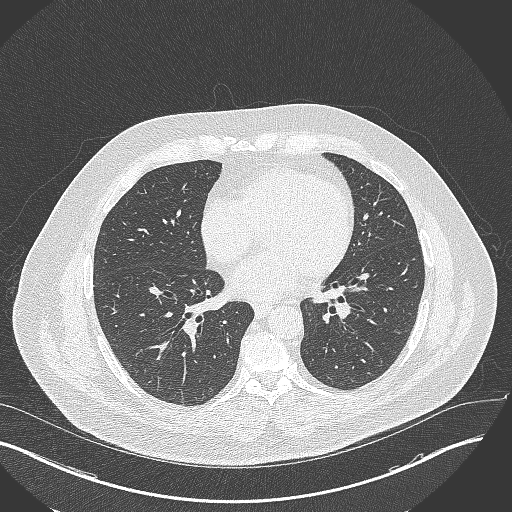
[im 182/334  lung]
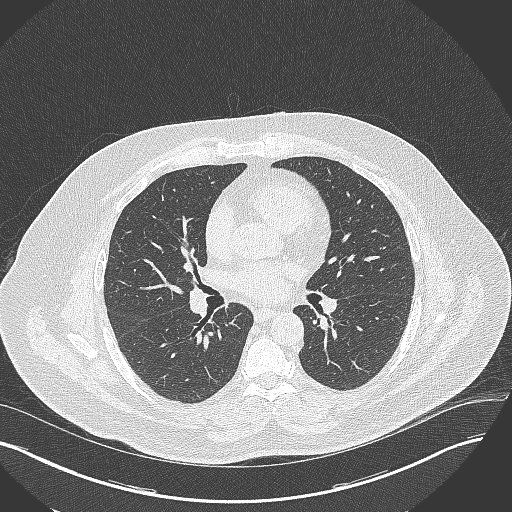
[im 212/334  lung]
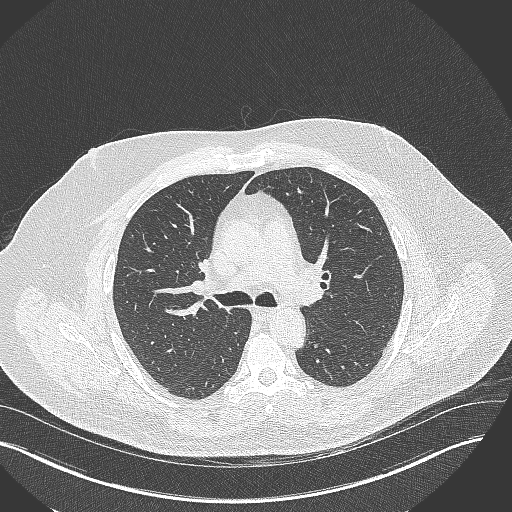
[im 228/334  mediastinal]
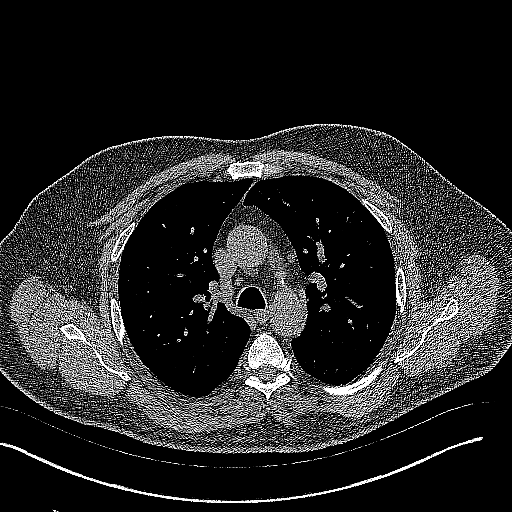
[im 228/334  lung]
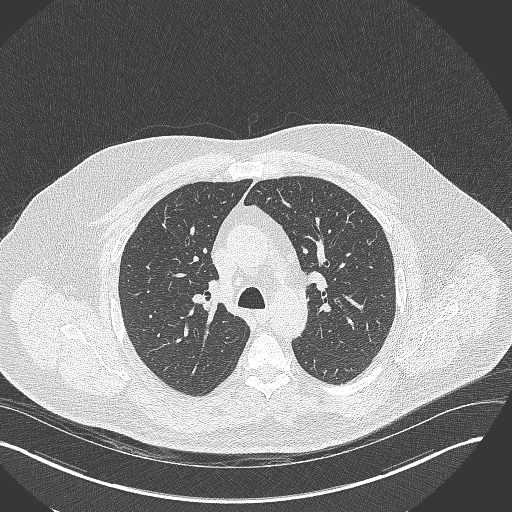
[im 258/334  lung]
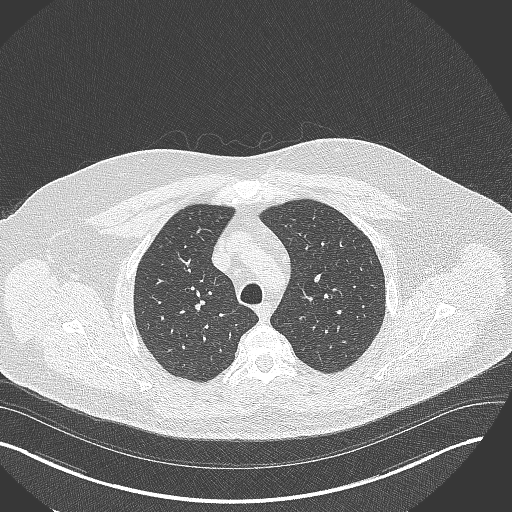
[im 288/334  lung]
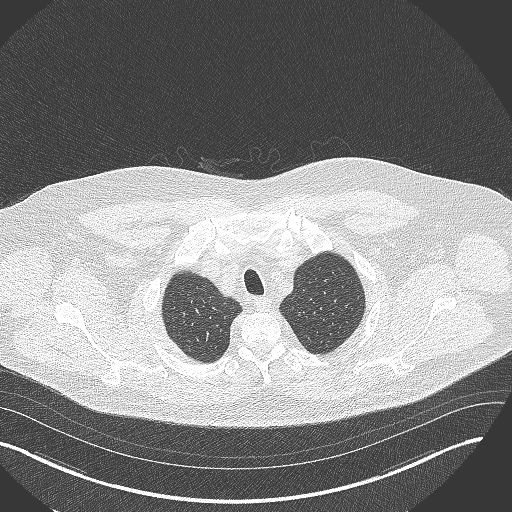
[im 318/334  lung]
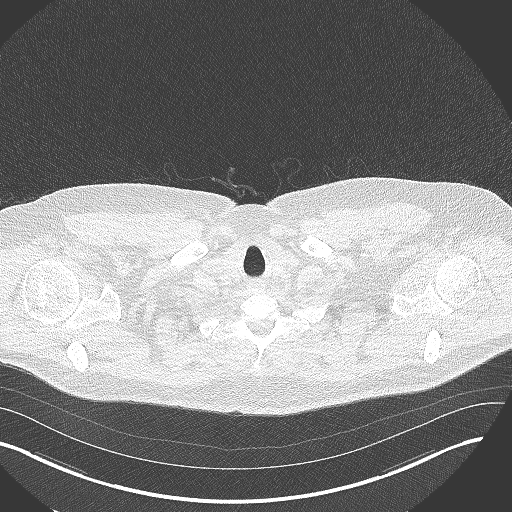

[Series 5: coronal · coronal · 0.65mm/px · 3 of 126 slices shown]
[im 26/126  lung]
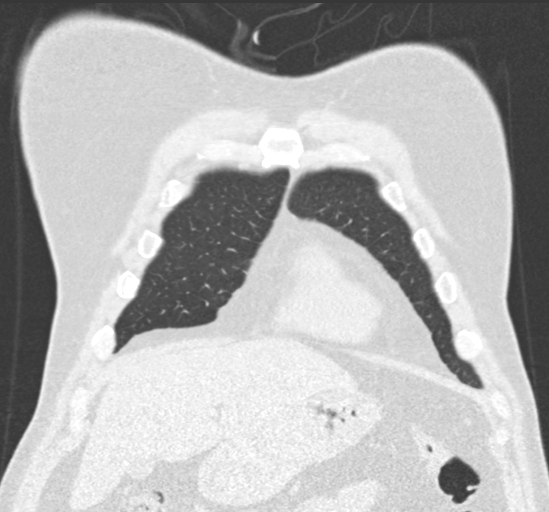
[im 51/126  lung]
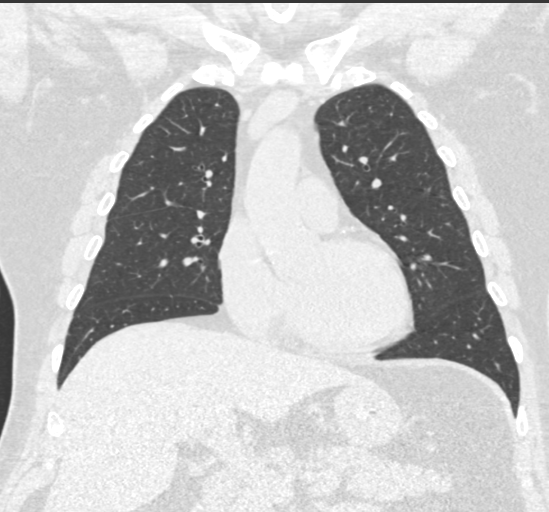
[im 76/126  lung]
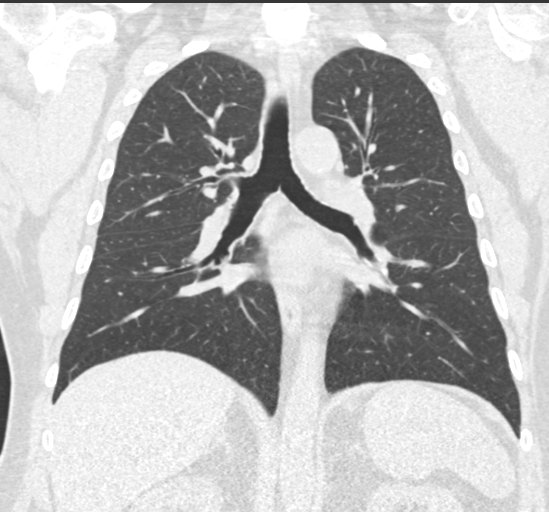

[15 of 36 positions shown; findings below may reference images not displayed]

FINDINGS: Cardiovascular: Aortic atherosclerosis. Normal heart size, without
pericardial effusion. Lad and right coronary artery calcification.

Mediastinum/Nodes: No mediastinal or definite hilar adenopathy,
given limitations of unenhanced CT.

Lungs/Pleura: No pleural fluid. Mild centrilobular emphysema. No
suspicious pulmonary nodule or mass.

Upper Abdomen: Nonspecific mild caudate lobe prominence. Normal
imaged portions of the spleen, stomach, pancreas, gallbladder,
adrenal glands, kidneys.

Musculoskeletal: No acute osseous abnormality.
IMPRESSION: 1. Lung-RADS 1, negative. Continue annual screening with low-dose
chest CT without contrast in 12 months.
2. Aortic Atherosclerosis (TX615-X8P.P) and Emphysema (TX615-7LE.Q).
Coronary artery atherosclerosis.

## 2023-04-08 ENCOUNTER — Ambulatory Visit: Payer: 59 | Admitting: Internal Medicine

## 2023-04-22 ENCOUNTER — Encounter: Payer: Self-pay | Admitting: Internal Medicine

## 2023-04-22 ENCOUNTER — Ambulatory Visit: Payer: 59 | Attending: Internal Medicine | Admitting: Internal Medicine

## 2023-04-22 VITALS — Ht 68.0 in | Wt 230.0 lb

## 2023-04-22 DIAGNOSIS — R931 Abnormal findings on diagnostic imaging of heart and coronary circulation: Secondary | ICD-10-CM | POA: Diagnosis not present

## 2023-04-22 NOTE — Patient Instructions (Signed)
 Medication Instructions:  No Changes *If you need a refill on your cardiac medications before your next appointment, please call your pharmacy*   Lab Work: No labs If you have labs (blood work) drawn today and your tests are completely normal, you will receive your results only by: MyChart Message (if you have MyChart) OR A paper copy in the mail If you have any lab test that is abnormal or we need to change your treatment, we will call you to review the results.   Testing/Procedures: No Testing   Follow-Up: At [facility], you and your health needs are our priority.  As part of our continuing mission to provide you with exceptional heart care, we have created designated Provider Care Teams.  These Care Teams include your primary Cardiologist (physician) and Advanced Practice Providers (APPs -  Physician Assistants and Nurse Practitioners) who all work together to provide you with the care you need, when you need it.  We recommend signing up for the patient portal called "MyChart".  Sign up information is provided on this After Visit Summary.  MyChart is used to connect with patients for Virtual Visits (Telemedicine).  Patients are able to view lab/test results, encounter notes, upcoming appointments, etc.  Non-urgent messages can be sent to your provider as well.   To learn more about what you can do with MyChart, go to [email or web address].    Your next appointment:   1 year(s)  Provider:   Azalee Course, PA-C      Other Instructions   1st Floor: - Lobby - Registration  - Pharmacy  - Lab - Cafe  2nd Floor: - PV Lab - Diagnostic Testing (echo, CT, nuclear med)  3rd Floor: - Vacant  4th Floor: - TCTS (cardiothoracic surgery) - AFib Clinic - Structural Heart Clinic - Vascular Surgery  - Vascular Ultrasound  5th Floor: - HeartCare Cardiology (general and EP) - Clinical Pharmacy for coumadin, hypertension, lipid, weight-loss medications, and med management  appointments    Valet parking services will be available as well.

## 2023-04-22 NOTE — Progress Notes (Addendum)
 Cardiology Office Note:    Date:  04/22/2023   ID:  Joseph Vance, DOB 09-22-1960, MRN [id number]  PCP:  Joseph Joe, MD   [facility] Providers Cardiologist:  Joseph Fus, MD     Referring MD: Joseph Joe, MD   No chief complaint on file. Family hx  History of Present Illness:    Joseph Vance is a 63 y.o. male with a hx of HLD, former smoker, depression, GERD, referral from [facility] for family hx of cardiovascular dx. CT in April for lung ca screening showed RCA and LAD dx.   Notes some CP with getting up from a chair or turning a certain way. Hasn't noticed it with mowing in the yard. It's left sided. It's been happening for several weeks. He's on lipitor 20 mg daily.  No DOE.  No orthopnea/PND or LE edema  Family Hx: Father had MI at 58 years old.   Social Hx: smoked from the age of 61s and quit 2022.   Interim Hx 03/23/2022 He is doing ok. No issues.    Interim hx 04/22/2023 Video Virtual Visit: He had doing well. He denies Cp , no shortness or breath.  Past Medical History:  Diagnosis Date   Allergies    Former smoker    Insomnia     Past Surgical History:  Procedure Laterality Date   MELANOMA EXCISION      Current Medications: No outpatient medications have been marked as taking for the 04/22/23 encounter (Appointment) with Joseph Fus, MD.     Allergies:   Codeine and Sulfa antibiotics   Social History   Socioeconomic History   Marital status: Married    Spouse name: Not on file   Number of children: Not on file   Years of education: Not on file   Highest education level: Not on file  Occupational History   Not on file  Tobacco Use   Smoking status: Former    Types: Cigarettes   Smokeless tobacco: Never   Tobacco comments:    Quit smoking before Christmas 2022  Substance and Sexual Activity   Alcohol use: Never   Drug use: Never   Sexual activity: Never  Other Topics Concern   Not on file  Social History Narrative    Not on file   Social Drivers of Health   Financial Resource Strain: Not on file  Food Insecurity: Not on file  Transportation Needs: Not on file  Physical Activity: Not on file  Stress: Not on file  Social Connections: Not on file     Family History: The patient's family history includes Heart disease in his father.  ROS:   Please see the history of present illness.     All other systems reviewed and are negative.  EKGs/Labs/Other Studies Reviewed:    The following studies were reviewed today:   EKG:  EKG is  ordered today.  The ekg ordered today demonstrates   12/22/2021- NSR  Recent Labs: No results found for requested labs within last 365 days.   Recent Lipid Panel No results found for: "CHOL", "TRIG", "HDL", "CHOLHDL", "VLDL", "LDLCALC", "LDLDIRECT"   Risk Assessment/Calculations:         Physical Exam:    VS:  There were no vitals filed for this visit.    Wt Readings from Last 3 Encounters:  03/23/22 230 lb 3.2 oz (104.4 kg)  12/22/21 222 lb (100.7 kg)  05/01/21 209 lb (94.8 kg)  Well appearing MMM Normal WOB No abdominal distension Normal Affect    ASSESSMENT:    CAD: seen on screening chest CT. Will start asa 81 mg daily.  LDL 65; at goal < 70 mg/dL 16/11/9602.  Continue lipitor 20 mg daily. With CP, obtained CTA, showed CAC score 354 which was 83rd percentile.  LDL 65 mg/dL. At goal. Echo was normal. -no changes from prior visit  PLAN:    In order of problems listed above:  Follow up in 12 months with an APP       Virtual Visit via Video Note  I connected with Joseph Vance on 04/28/23 at  9:00 AM EST by a video enabled telemedicine application and verified that I am speaking with the correct person using two identifiers.  Location: Patient: Home Provider: Home   I discussed the limitations of evaluation and management by telemedicine and the availability of in person appointments. The patient expressed understanding and agreed  to proceed.    I discussed the assessment and treatment plan with the patient. The patient was provided an opportunity to ask questions and all were answered. The patient agreed with the plan and demonstrated an understanding of the instructions.   The patient was advised to call back or seek an in-person evaluation if the symptoms worsen or if the condition fails to improve as anticipated.  I provided 15 minutes of non-face-to-face time during this encounter.   Joseph Fus, MD   Medication Adjustments/Labs and Tests Ordered: Current medicines are reviewed at length with the patient today.  Concerns regarding medicines are outlined above.  No orders of the defined types were placed in this encounter.  No orders of the defined types were placed in this encounter.   There are no Patient Instructions on file for this visit.   Signed, Joseph Fus, MD  04/22/2023 8:27 AM    [facility]

## 2023-04-28 NOTE — Addendum Note (Signed)
 Addended byCarolan Clines on: 04/28/2023 11:51 AM   Modules accepted: Level of Service

## 2023-06-09 ENCOUNTER — Ambulatory Visit (HOSPITAL_BASED_OUTPATIENT_CLINIC_OR_DEPARTMENT_OTHER)
Admission: RE | Admit: 2023-06-09 | Discharge: 2023-06-09 | Disposition: A | Discharge status: Home / Self Care | Source: Ambulatory Visit | Attending: Acute Care | Admitting: Acute Care

## 2023-06-09 DIAGNOSIS — Z122 Encounter for screening for malignant neoplasm of respiratory organs: Secondary | ICD-10-CM | POA: Diagnosis present

## 2023-06-09 DIAGNOSIS — Z87891 Personal history of nicotine dependence: Secondary | ICD-10-CM | POA: Diagnosis present

## 2023-07-05 ENCOUNTER — Other Ambulatory Visit: Payer: Self-pay

## 2023-07-05 DIAGNOSIS — Z122 Encounter for screening for malignant neoplasm of respiratory organs: Secondary | ICD-10-CM

## 2023-07-05 DIAGNOSIS — Z87891 Personal history of nicotine dependence: Secondary | ICD-10-CM

## 2023-07-05 DIAGNOSIS — F17210 Nicotine dependence, cigarettes, uncomplicated: Secondary | ICD-10-CM
